# Patient Record
Sex: Female | Born: 2000 | Race: Black or African American | Hispanic: No | Marital: Single | Smoking: Never smoker
Health system: Southern US, Community
[De-identification: ages and names within clinical notes are randomized; demographics above are authoritative.]

---
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
## 2019-01-16 DIAGNOSIS — Z00129 Encounter for routine child health examination without abnormal findings: Secondary | ICD-10-CM | POA: Diagnosis not present

## 2019-01-16 DIAGNOSIS — Z113 Encounter for screening for infections with a predominantly sexual mode of transmission: Secondary | ICD-10-CM | POA: Diagnosis not present

## 2019-08-16 DIAGNOSIS — H16223 Keratoconjunctivitis sicca, not specified as Sjogren's, bilateral: Secondary | ICD-10-CM | POA: Diagnosis not present

## 2019-10-13 DIAGNOSIS — Z113 Encounter for screening for infections with a predominantly sexual mode of transmission: Secondary | ICD-10-CM | POA: Diagnosis not present

## 2019-10-13 DIAGNOSIS — A749 Chlamydial infection, unspecified: Secondary | ICD-10-CM | POA: Diagnosis not present

## 2019-10-13 DIAGNOSIS — M545 Low back pain: Secondary | ICD-10-CM | POA: Diagnosis not present

## 2019-10-13 DIAGNOSIS — N913 Primary oligomenorrhea: Secondary | ICD-10-CM | POA: Diagnosis not present

## 2019-11-10 DIAGNOSIS — Z113 Encounter for screening for infections with a predominantly sexual mode of transmission: Secondary | ICD-10-CM | POA: Diagnosis not present

## 2019-11-10 DIAGNOSIS — E282 Polycystic ovarian syndrome: Secondary | ICD-10-CM | POA: Diagnosis not present

## 2019-12-25 DIAGNOSIS — H5213 Myopia, bilateral: Secondary | ICD-10-CM | POA: Diagnosis not present

## 2020-01-06 DIAGNOSIS — H5213 Myopia, bilateral: Secondary | ICD-10-CM | POA: Diagnosis not present

## 2020-01-06 DIAGNOSIS — H1013 Acute atopic conjunctivitis, bilateral: Secondary | ICD-10-CM | POA: Diagnosis not present

## 2020-01-30 DIAGNOSIS — M25511 Pain in right shoulder: Secondary | ICD-10-CM | POA: Diagnosis not present

## 2020-01-30 DIAGNOSIS — G8929 Other chronic pain: Secondary | ICD-10-CM | POA: Diagnosis not present

## 2020-05-19 DIAGNOSIS — E282 Polycystic ovarian syndrome: Secondary | ICD-10-CM | POA: Diagnosis not present

## 2020-05-19 DIAGNOSIS — Z113 Encounter for screening for infections with a predominantly sexual mode of transmission: Secondary | ICD-10-CM | POA: Diagnosis not present

## 2020-06-26 DIAGNOSIS — J029 Acute pharyngitis, unspecified: Secondary | ICD-10-CM | POA: Diagnosis not present

## 2020-06-26 DIAGNOSIS — Z20822 Contact with and (suspected) exposure to covid-19: Secondary | ICD-10-CM | POA: Diagnosis not present

## 2020-08-04 DIAGNOSIS — Z20822 Contact with and (suspected) exposure to covid-19: Secondary | ICD-10-CM | POA: Diagnosis not present

## 2020-10-12 DIAGNOSIS — R519 Headache, unspecified: Secondary | ICD-10-CM | POA: Diagnosis not present

## 2020-10-12 DIAGNOSIS — R1110 Vomiting, unspecified: Secondary | ICD-10-CM | POA: Diagnosis not present

## 2020-10-12 DIAGNOSIS — J3489 Other specified disorders of nose and nasal sinuses: Secondary | ICD-10-CM | POA: Diagnosis not present

## 2020-10-12 DIAGNOSIS — K625 Hemorrhage of anus and rectum: Secondary | ICD-10-CM | POA: Diagnosis not present

## 2020-10-13 DIAGNOSIS — R519 Headache, unspecified: Secondary | ICD-10-CM | POA: Diagnosis not present

## 2020-10-13 DIAGNOSIS — K625 Hemorrhage of anus and rectum: Secondary | ICD-10-CM | POA: Diagnosis not present

## 2020-10-13 DIAGNOSIS — J3489 Other specified disorders of nose and nasal sinuses: Secondary | ICD-10-CM | POA: Diagnosis not present

## 2020-10-13 DIAGNOSIS — R1110 Vomiting, unspecified: Secondary | ICD-10-CM | POA: Diagnosis not present

## 2020-10-26 DIAGNOSIS — S99921A Unspecified injury of right foot, initial encounter: Secondary | ICD-10-CM | POA: Diagnosis not present

## 2020-10-26 DIAGNOSIS — R0789 Other chest pain: Secondary | ICD-10-CM | POA: Diagnosis not present

## 2020-10-26 DIAGNOSIS — M25512 Pain in left shoulder: Secondary | ICD-10-CM | POA: Diagnosis not present

## 2020-10-26 DIAGNOSIS — S29009A Unspecified injury of muscle and tendon of unspecified wall of thorax, initial encounter: Secondary | ICD-10-CM | POA: Diagnosis not present

## 2020-10-26 DIAGNOSIS — M79674 Pain in right toe(s): Secondary | ICD-10-CM | POA: Diagnosis not present

## 2020-10-27 DIAGNOSIS — S29009A Unspecified injury of muscle and tendon of unspecified wall of thorax, initial encounter: Secondary | ICD-10-CM | POA: Diagnosis not present

## 2020-10-27 DIAGNOSIS — R0789 Other chest pain: Secondary | ICD-10-CM | POA: Diagnosis not present

## 2020-12-22 DIAGNOSIS — G5602 Carpal tunnel syndrome, left upper limb: Secondary | ICD-10-CM | POA: Diagnosis not present

## 2020-12-22 DIAGNOSIS — S4992XA Unspecified injury of left shoulder and upper arm, initial encounter: Secondary | ICD-10-CM | POA: Diagnosis not present

## 2020-12-22 DIAGNOSIS — M7582 Other shoulder lesions, left shoulder: Secondary | ICD-10-CM | POA: Diagnosis not present

## 2021-02-23 DIAGNOSIS — S99922A Unspecified injury of left foot, initial encounter: Secondary | ICD-10-CM | POA: Diagnosis not present

## 2021-02-23 DIAGNOSIS — M79672 Pain in left foot: Secondary | ICD-10-CM | POA: Diagnosis not present

## 2021-02-23 DIAGNOSIS — Z113 Encounter for screening for infections with a predominantly sexual mode of transmission: Secondary | ICD-10-CM | POA: Diagnosis not present

## 2021-02-23 DIAGNOSIS — N913 Primary oligomenorrhea: Secondary | ICD-10-CM | POA: Diagnosis not present

## 2021-02-23 DIAGNOSIS — M7989 Other specified soft tissue disorders: Secondary | ICD-10-CM | POA: Diagnosis not present

## 2021-09-08 DIAGNOSIS — N926 Irregular menstruation, unspecified: Secondary | ICD-10-CM | POA: Diagnosis not present

## 2021-09-08 DIAGNOSIS — Z3202 Encounter for pregnancy test, result negative: Secondary | ICD-10-CM | POA: Diagnosis not present

## 2021-09-27 ENCOUNTER — Emergency Department
Admission: EM | Admit: 2021-09-27 | Discharge: 2021-09-27 | Disposition: A | Discharge status: Home / Self Care | Payer: Medicaid Other | Attending: Emergency Medicine | Admitting: Emergency Medicine

## 2021-09-27 ENCOUNTER — Other Ambulatory Visit: Payer: Self-pay

## 2021-09-27 ENCOUNTER — Emergency Department: Payer: Medicaid Other

## 2021-09-27 DIAGNOSIS — R0602 Shortness of breath: Secondary | ICD-10-CM | POA: Diagnosis not present

## 2021-09-27 DIAGNOSIS — D72829 Elevated white blood cell count, unspecified: Secondary | ICD-10-CM | POA: Insufficient documentation

## 2021-09-27 DIAGNOSIS — R0789 Other chest pain: Secondary | ICD-10-CM | POA: Diagnosis present

## 2021-09-27 DIAGNOSIS — F1210 Cannabis abuse, uncomplicated: Secondary | ICD-10-CM | POA: Insufficient documentation

## 2021-09-27 DIAGNOSIS — R000 Tachycardia, unspecified: Secondary | ICD-10-CM | POA: Diagnosis not present

## 2021-09-27 LAB — COMPREHENSIVE METABOLIC PANEL
ALT: 13 U/L (ref 0–44)
AST: 22 U/L (ref 15–41)
Albumin: 4.5 g/dL (ref 3.5–5.0)
Alkaline Phosphatase: 107 U/L (ref 38–126)
Anion gap: 8 (ref 5–15)
BUN: 11 mg/dL (ref 6–20)
CO2: 26 mmol/L (ref 22–32)
Calcium: 9.4 mg/dL (ref 8.9–10.3)
Chloride: 105 mmol/L (ref 98–111)
Creatinine, Ser: 0.87 mg/dL (ref 0.44–1.00)
GFR, Estimated: >60 mL/min (ref >60)
Glucose, Bld: 102 mg/dL — ABNORMAL HIGH (ref 70–99)
Potassium: 3.5 mmol/L (ref 3.5–5.1)
Sodium: 139 mmol/L (ref 135–145)
Total Bilirubin: 0.5 mg/dL (ref 0.3–1.2)
Total Protein: 8.4 g/dL — ABNORMAL HIGH (ref 6.5–8.1)

## 2021-09-27 LAB — POC URINE PREG, ED: Preg Test, Ur: NEGATIVE

## 2021-09-27 LAB — CBC WITH DIFFERENTIAL/PLATELET
Abs Immature Granulocytes: 0.04 K/uL (ref 0.00–0.07)
Basophils Absolute: 0 K/uL (ref 0.0–0.1)
Basophils Relative: 0 %
Eosinophils Absolute: 0.2 K/uL (ref 0.0–0.5)
Eosinophils Relative: 2 %
HCT: 40.3 % (ref 36.0–46.0)
Hemoglobin: 12.4 g/dL (ref 12.0–15.0)
Immature Granulocytes: 0 %
Lymphocytes Relative: 10 %
Lymphs Abs: 1.4 K/uL (ref 0.7–4.0)
MCH: 21.6 pg — ABNORMAL LOW (ref 26.0–34.0)
MCHC: 30.8 g/dL (ref 30.0–36.0)
MCV: 70.3 fL — ABNORMAL LOW (ref 80.0–100.0)
Monocytes Absolute: 0.6 K/uL (ref 0.1–1.0)
Monocytes Relative: 4 %
Neutro Abs: 11.1 K/uL — ABNORMAL HIGH (ref 1.7–7.7)
Neutrophils Relative %: 84 %
Platelets: 264 K/uL (ref 150–400)
RBC: 5.73 MIL/uL — ABNORMAL HIGH (ref 3.87–5.11)
RDW: 14.2 % (ref 11.5–15.5)
WBC: 13.3 K/uL — ABNORMAL HIGH (ref 4.0–10.5)
nRBC: 0 % (ref 0.0–0.2)

## 2021-09-27 LAB — TROPONIN I (HIGH SENSITIVITY)
Troponin I (High Sensitivity): <2 ng/L (ref <18)
Troponin I (High Sensitivity): <2 ng/L (ref <18)

## 2021-09-27 LAB — D-DIMER, QUANTITATIVE: D-Dimer, Quant: 0.31 ug{FEU}/mL (ref 0.00–0.50)

## 2021-09-27 MED ORDER — IPRATROPIUM-ALBUTEROL 0.5-2.5 (3) MG/3ML IN SOLN
3.0000 mL | Freq: Once | RESPIRATORY_TRACT | Status: AC
  Administered 2021-09-27: 3 mL via RESPIRATORY_TRACT
  Filled 2021-09-27: qty 3

## 2021-09-27 MED ORDER — LACTATED RINGERS IV BOLUS
1000.0000 mL | Freq: Once | INTRAVENOUS | Status: AC
  Administered 2021-09-27: 1000 mL via INTRAVENOUS

## 2021-09-27 MED ORDER — KETOROLAC TROMETHAMINE 30 MG/ML IJ SOLN
15.0000 mg | Freq: Once | INTRAMUSCULAR | Status: AC
  Administered 2021-09-27: 15 mg via INTRAVENOUS
  Filled 2021-09-27: qty 1

## 2021-09-27 MED ORDER — ALBUTEROL SULFATE HFA 108 (90 BASE) MCG/ACT IN AERS: 2.0000 | INHALATION_SPRAY | Freq: Four times a day (QID) | RESPIRATORY_TRACT | 2 refills | Status: AC | PRN

## 2021-09-27 NOTE — ED Triage Notes (Signed)
Pt states that she started with a dry cough yesterday, but states today she is having chest tightness with difficulty breathing, states that she can't inhale completely and is having to take short shallow breaths, pt is tearful, pt has audible inspiratory wheezing, denies hx of asthma, pt is tachycardic without fever in triage ?

## 2021-09-27 NOTE — ED Provider Notes (Signed)
? ?[facility] ?Provider Note ? ? ? Event Date/Time  ? First MD Initiated Contact with Patient 09/27/21 [phone]   ?  (approximate) ? ? ?History  ? ?Chest Pain ? ? ?HPI ? ?Brandy Bray is a 21 y.o. female who presents to the ED for evaluation of Chest Pain ?  ?Morbidly obese patient.  No OCPs. ? ?Patient presents to the ED, accompanied by her boyfriend, for evaluation of pleuritic chest pain since last night.  About 12 hours ago, she reports developing atraumatic substernal sharp chest discomfort that is pleuritic.  Denies any chest pain when she is not breathing, only has sharp pains when she takes a deep breath in.  Reports feeling occasional presyncopal dizziness without syncope.  Reports nonproductive cough. ? ?Reports regularly smoking cannabis without cigarettes or other recreational drugs.  Denies cocaine. ? ?Physical Exam  ? ?Triage Vital Signs: ?ED Triage Vitals  ?Enc Vitals Group  ?   BP   ?   Pulse   ?   Resp   ?   Temp   ?   Temp src   ?   SpO2   ?   Weight   ?   Height   ?   Head Circumference   ?   Peak Flow   ?   Pain Score   ?   Pain Loc   ?   Pain Edu?   ?   Excl. in GC?   ? ? ?Most recent vital signs: ?Vitals:  ? 09/27/21 0900 09/27/21 1017  ?BP: 103/60 116/90  ?Pulse: (!) 105 (!) 106  ?Resp: 18 18  ?Temp:    ?SpO2: 100% 97%  ? ? ?General: Awake, no distress.  Morbidly obese.  Pleasant and conversational. ?CV:  Good peripheral perfusion. RRR ?Resp:  Normal effort.  No tachypnea or distress.  Faint and scattered expiratory wheezing is noted. ?Abd:  No distention.  Soft and benign ?MSK:  No deformity noted.  ?Neuro:  No focal deficits appreciated. ?Other:   ? ? ?ED Results / Procedures / Treatments  ? ?Labs ?(all labs ordered are listed, but only abnormal results are displayed) ?Labs Reviewed  ?COMPREHENSIVE METABOLIC PANEL - Abnormal; Notable for the following components:  ?    Result Value  ? Glucose, Bld 102 (*)   ? Total Protein 8.4 (*)   ? All other components within  normal limits  ?CBC WITH DIFFERENTIAL/PLATELET - Abnormal; Notable for the following components:  ? WBC 13.3 (*)   ? RBC 5.73 (*)   ? MCV 70.3 (*)   ? MCH 21.6 (*)   ? Neutro Abs 11.1 (*)   ? All other components within normal limits  ?D-DIMER, QUANTITATIVE  ?POC URINE PREG, ED  ?TROPONIN I (HIGH SENSITIVITY)  ?TROPONIN I (HIGH SENSITIVITY)  ? ? ?EKG ?Sinus tachycardia, rate of 117 bpm.  Normal axis and intervals.  No evidence of acute ischemia.  Somewhat tremulous baseline ? ?RADIOLOGY ?CXR reviewed by me without evidence of acute cardiopulmonary pathology. ? ?Official radiology report(s): ?DG Chest Portable 1 View ? ?Result Date: 09/27/2021 ?CLINICAL DATA:  Shortness of breath, tachycardia EXAM: PORTABLE CHEST 1 VIEW COMPARISON:  08/24/2005 FINDINGS: Transverse diameter of heart is slightly increased. There are no signs of alveolar pulmonary edema or focal pulmonary consolidation. There is no significant pleural effusion or pneumothorax. IMPRESSION: There are no signs of pulmonary edema or focal pulmonary consolidation. Electronically Signed   By: Ernie Avena M.D.   On: 09/27/2021  08:37   ? ?PROCEDURES and INTERVENTIONS: ? ?.1-3 Lead EKG Interpretation ?Performed by: Delton Prairie, MD ?Authorized by: Delton Prairie, MD  ? ?  Interpretation: abnormal   ?  ECG rate:  106 ?  ECG rate assessment: tachycardic   ?  Rhythm: sinus tachycardia   ?  Ectopy: none   ?  Conduction: normal   ? ?Medications  ?lactated ringers bolus 1,000 mL (1,000 mLs Intravenous New Bag/Given 09/27/21 0841)  ?ipratropium-albuterol (DUONEB) 0.5-2.5 (3) MG/3ML nebulizer solution 3 mL (3 mLs Nebulization Given 09/27/21 0841)  ?ketorolac (TORADOL) 30 MG/ML injection 15 mg (15 mg Intravenous Given 09/27/21 1018)  ? ? ? ?IMPRESSION / MDM / ASSESSMENT AND PLAN / ED COURSE  ?I reviewed the triage vital signs and the nursing notes. ? ?21 year old female presents to the ED with atypical chest discomfort, possibly due to wheezing and cannabis abuse,  ultimately suitable for outpatient management.  She looks well without distress, but has some mild wheezing on pulmonary auscultation.  Blood work with mild nonspecific leukocytosis.  Normal metabolic panel.  2 negative troponins and a negative D-dimer, making PE unlikely.  No evidence of ACS, PTX or infiltrate.  Resolving symptoms with fluids and a breathing treatment.  I see no barriers to outpatient management or indications for further diagnostics today.  Provided the patient a prescription for albuterol and discussed close return precautions for the ED. ? ?Clinical Course as of 09/27/21 1129  ?Tue Sep 27, 2021  ?0936 Reassessed.  Patient reports feeling better.  Heart rate improving and now in the 90s [DS]  ?1119 Reassessed. Feeling better. [DS]  ?  ?Clinical Course User Index ?[DS] Delton Prairie, MD  ? ? ? ?FINAL CLINICAL IMPRESSION(S) / ED DIAGNOSES  ? ?Final diagnoses:  ?Other chest pain  ?Cannabis abuse  ? ? ? ?Rx / DC Orders  ? ?ED Discharge Orders   ? ?      Ordered  ?  albuterol (VENTOLIN HFA) 108 (90 Base) MCG/ACT inhaler  Every 6 hours PRN       ? 09/27/21 1059  ? ?  ?  ? ?  ? ? ? ?Note:  This document was prepared using Dragon voice recognition software and may include unintentional dictation errors. ?  ?Delton Prairie, MD ?09/27/21 1130 ? ?

## 2021-11-03 DIAGNOSIS — N926 Irregular menstruation, unspecified: Secondary | ICD-10-CM | POA: Diagnosis not present

## 2021-11-03 DIAGNOSIS — Z0001 Encounter for general adult medical examination with abnormal findings: Secondary | ICD-10-CM | POA: Diagnosis not present

## 2022-01-28 ENCOUNTER — Encounter: Payer: Self-pay | Admitting: Emergency Medicine

## 2022-01-28 ENCOUNTER — Emergency Department
Admission: EM | Admit: 2022-01-28 | Discharge: 2022-01-28 | Disposition: A | Discharge status: Home / Self Care | Payer: Medicaid Other | Attending: Emergency Medicine | Admitting: Emergency Medicine

## 2022-01-28 ENCOUNTER — Other Ambulatory Visit: Payer: Self-pay

## 2022-01-28 DIAGNOSIS — N939 Abnormal uterine and vaginal bleeding, unspecified: Secondary | ICD-10-CM

## 2022-01-28 DIAGNOSIS — N760 Acute vaginitis: Secondary | ICD-10-CM | POA: Diagnosis not present

## 2022-01-28 DIAGNOSIS — B9689 Other specified bacterial agents as the cause of diseases classified elsewhere: Secondary | ICD-10-CM | POA: Insufficient documentation

## 2022-01-28 DIAGNOSIS — D649 Anemia, unspecified: Secondary | ICD-10-CM | POA: Insufficient documentation

## 2022-01-28 DIAGNOSIS — Z6740 Type O blood, Rh positive: Secondary | ICD-10-CM | POA: Insufficient documentation

## 2022-01-28 LAB — COMPREHENSIVE METABOLIC PANEL
ALT: 11 U/L (ref 0–44)
AST: 14 U/L — ABNORMAL LOW (ref 15–41)
Albumin: 4 g/dL (ref 3.5–5.0)
Alkaline Phosphatase: 88 U/L (ref 38–126)
Anion gap: 6 (ref 5–15)
BUN: 12 mg/dL (ref 6–20)
CO2: 21 mmol/L — ABNORMAL LOW (ref 22–32)
Calcium: 8.9 mg/dL (ref 8.9–10.3)
Chloride: 110 mmol/L (ref 98–111)
Creatinine, Ser: 0.73 mg/dL (ref 0.44–1.00)
GFR, Estimated: >60 mL/min (ref >60)
Glucose, Bld: 104 mg/dL — ABNORMAL HIGH (ref 70–99)
Potassium: 3.5 mmol/L (ref 3.5–5.1)
Sodium: 137 mmol/L (ref 135–145)
Total Bilirubin: 0.3 mg/dL (ref 0.3–1.2)
Total Protein: 6.9 g/dL (ref 6.5–8.1)

## 2022-01-28 LAB — CHLAMYDIA/NGC RT PCR (ARMC ONLY)
Chlamydia Tr: NOT DETECTED
N gonorrhoeae: NOT DETECTED

## 2022-01-28 LAB — URINALYSIS, ROUTINE W REFLEX MICROSCOPIC
Bacteria, UA: NONE SEEN
Bilirubin Urine: NEGATIVE
Glucose, UA: NEGATIVE mg/dL
Ketones, ur: NEGATIVE mg/dL
Leukocytes,Ua: NEGATIVE
Nitrite: NEGATIVE
Protein, ur: NEGATIVE mg/dL
Specific Gravity, Urine: 1.027 (ref 1.005–1.030)
pH: 5 (ref 5.0–8.0)

## 2022-01-28 LAB — WET PREP, GENITAL
Sperm: NONE SEEN
Trich, Wet Prep: NONE SEEN
WBC, Wet Prep HPF POC: <10 (ref <10)
Yeast Wet Prep HPF POC: NONE SEEN

## 2022-01-28 LAB — CBC
HCT: 37.1 % (ref 36.0–46.0)
Hemoglobin: 11.2 g/dL — ABNORMAL LOW (ref 12.0–15.0)
MCH: 21.3 pg — ABNORMAL LOW (ref 26.0–34.0)
MCHC: 30.2 g/dL (ref 30.0–36.0)
MCV: 70.5 fL — ABNORMAL LOW (ref 80.0–100.0)
Platelets: 257 10*3/uL (ref 150–400)
RBC: 5.26 MIL/uL — ABNORMAL HIGH (ref 3.87–5.11)
RDW: 14.3 % (ref 11.5–15.5)
WBC: 9.4 10*3/uL (ref 4.0–10.5)
nRBC: 0 % (ref 0.0–0.2)

## 2022-01-28 LAB — ABO/RH: ABO/RH(D): O POS

## 2022-01-28 LAB — POC URINE PREG, ED: Preg Test, Ur: NEGATIVE

## 2022-01-28 LAB — HCG, QUANTITATIVE, PREGNANCY: hCG, Beta Chain, Quant, S: 2 m[IU]/mL (ref <5)

## 2022-01-28 MED ORDER — METRONIDAZOLE 500 MG PO TABS: 500.0000 mg | ORAL_TABLET | Freq: Two times a day (BID) | ORAL | 0 refills | Status: AC

## 2022-01-28 NOTE — ED Triage Notes (Signed)
Pt via POV from home. Pt c/o lower abd cramping and vaginal bleeding that started 3 days ago. Pt states she did have a positive pregnancy test. LMP 5/10. G1. Pt is A&Ox4 and NAD

## 2022-01-28 NOTE — ED Provider Triage Note (Signed)
  Emergency Medicine Provider Triage Evaluation Note  Brandy Bray , a 21 y.o.female,  was evaluated in triage.  Pt complains of vaginal bleeding and lower abdominal pain x3 days.  She states that she recently had a positive pregnancy test, LMP 5/10.  G1.   Review of Systems  Positive: Lower abdominal cramping, vaginal bleeding Negative: Denies fever, chest pain, vomiting  Physical Exam  There were no vitals filed for this visit. Gen:   Awake, no distress   Resp:  Normal effort  MSK:   Moves extremities without difficulty  Other:    Medical Decision Making  Given the patient's initial medical screening exam, the following diagnostic evaluation has been ordered. The patient will be placed in the appropriate treatment space, once one is available, to complete the evaluation and treatment. I have discussed the plan of care with the patient and I have advised the patient that an ED physician or mid-level practitioner will reevaluate their condition after the test results have been received, as the results may give them additional insight into the type of treatment they may need.    Diagnostics: Labs, UA, transvaginal ultrasound  Treatments: none immediately   Varney Daily, [place] 01/28/22 1211

## 2022-01-28 NOTE — Discharge Instructions (Signed)
Begin taking metronidazole twice a day for the next 7 days.  This is the antibiotic for bacterial vaginosis.  If you continue to have any problems after taking this medication you should follow-up with your OB/GYN.

## 2022-01-28 NOTE — ED Provider Notes (Signed)
[facility] Provider Note    Event Date/Time   First MD Initiated Contact with Patient 01/28/22 1224     (approximate)   History   Abdominal Pain and Vaginal Bleeding   HPI  Brandy Bray is a 21 y.o. female   presents to the ED with complaint of lower abdominal cramping and vaginal bleeding for the last 3 days.  Patient states prior to that she had a positive pregnancy test.  LMP was 11/26/2021.  Patient denies any nausea, vomiting or diarrhea.  She denies any fever or chills.  She states on the average she is going through 3 pads per day.  No medical history.      Physical Exam   Triage Vital Signs: ED Triage Vitals  Enc Vitals Group     BP --      Pulse --      Resp --      Temp --      Temp src --      SpO2 --      Weight 01/28/22 1201 260 lb (117.9 kg)     Height 01/28/22 1201 5\' 8"  (1.727 m)     Head Circumference --      Peak Flow --      Pain Score 01/28/22 1200 0     Pain Loc --      Pain Edu? --      Excl. in GC? --     Most recent vital signs: Vitals:   01/28/22 1400 01/28/22 1411  BP: (!) 110/49 (!) 110/49  Pulse: 62 62  Resp:  18  Temp:  98.8 F (37.1 C)  SpO2: 100% 100%     General: Awake, no distress.  CV:  Good peripheral perfusion.  Heart regular rate and rhythm. Resp:  Normal effort.  Lungs are clear bilaterally. Abd:  No distention.  Other:  Abdomen soft, obese, bowel sounds normoactive x4 quadrants.  Minimal tenderness noted suprapubic area.   ED Results / Procedures / Treatments   Labs (all labs ordered are listed, but only abnormal results are displayed) Labs Reviewed  WET PREP, GENITAL - Abnormal; Notable for the following components:      Result Value   Clue Cells Wet Prep HPF POC PRESENT (*)    All other components within normal limits  COMPREHENSIVE METABOLIC PANEL - Abnormal; Notable for the following components:   CO2 21 (*)    Glucose, Bld 104 (*)    AST 14 (*)    All other components  within normal limits  CBC - Abnormal; Notable for the following components:   RBC 5.26 (*)    Hemoglobin 11.2 (*)    MCV 70.5 (*)    MCH 21.3 (*)    All other components within normal limits  URINALYSIS, ROUTINE W REFLEX MICROSCOPIC - Abnormal; Notable for the following components:   Color, Urine YELLOW (*)    APPearance CLEAR (*)    Hgb urine dipstick SMALL (*)    All other components within normal limits  CHLAMYDIA/NGC RT PCR ([facility] ONLY)            HCG, QUANTITATIVE, PREGNANCY  POC URINE PREG, ED  ABO/RH     PROCEDURES:  Critical Care performed:   Procedures   MEDICATIONS ORDERED IN ED: Medications - No data to display   IMPRESSION / MDM / ASSESSMENT AND PLAN / ED COURSE  I reviewed the triage vital signs and the nursing notes.  Differential diagnosis includes, but is not limited to, pregnancy, abnormal vaginal bleeding, bacterial vaginosis.  21 year old female comes to the emergency department with complaint of abnormal vaginal bleeding and not having a period since 11/16/2021.  Patient states that she had a positive pregnancy test.  Urinalysis was negative, CMP unremarkable, CBC showed hemoglobin slightly low at 11.2.  Wet prep showed clue cells.  Beta hCG was 2 and a ABO/Rh was O+.  Urine pregnancy test was negative.  Patient was made aware that her pregnancy test here was negative and also her beta hCG was 2.  Clue cells were positive indicating that she has a bacterial vaginosis.  She agrees to take the antibiotic for the next 7 days and to follow-up with her gynecologist if any continued vaginal bleeding or discomfort.  At the time of her discharge her GC and Chlamydia test were still pending.      Patient's presentation is most consistent with acute complicated illness / injury requiring diagnostic workup.  FINAL CLINICAL IMPRESSION(S) / ED DIAGNOSES   Final diagnoses:  BV (bacterial vaginosis)  Vaginal bleeding     Rx / DC Orders   ED Discharge Orders           Ordered    metroNIDAZOLE (FLAGYL) 500 MG tablet  2 times daily        01/28/22 1501             Note:  This document was prepared using Dragon voice recognition software and may include unintentional dictation errors.   Tommi Rumps, PA-C 01/28/22 1511    Merwyn Katos, MD 01/28/22 [phone]

## 2023-06-11 IMAGING — DX DG CHEST 1V PORT
1 series · 1 of 1 positions shown · non-contrast
Comparison: 08/24/2005

CLINICAL DATA: Shortness of breath, tachycardia

EXAM:
PORTABLE CHEST 1 VIEW

[chest ap]
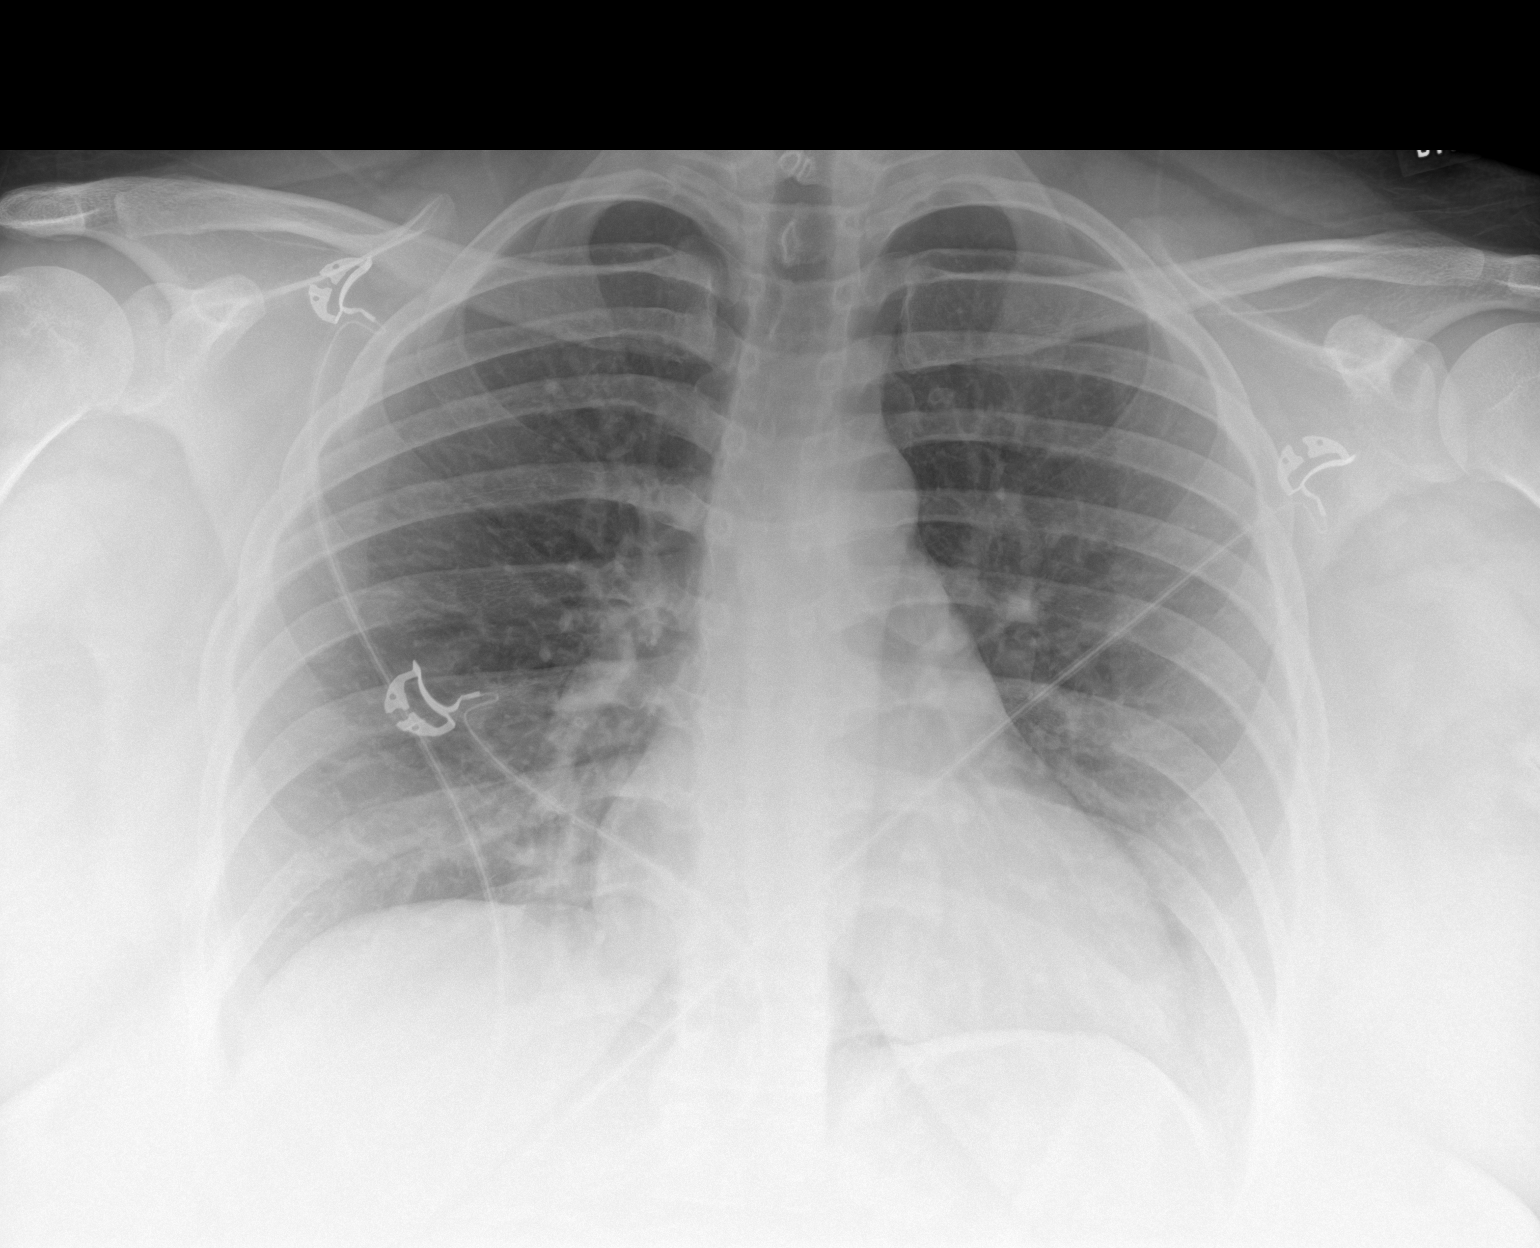

[1 of 1 positions shown; findings below may reference images not displayed]

FINDINGS: Transverse diameter of heart is slightly increased. There are no
signs of alveolar pulmonary edema or focal pulmonary consolidation.
There is no significant pleural effusion or pneumothorax.
IMPRESSION: There are no signs of pulmonary edema or focal pulmonary
consolidation.
